# Patient Record
Sex: Female | Born: 1975 | Race: White | Hispanic: No | Marital: Married | Smoking: Former smoker
Health system: Southern US, Community
[De-identification: ages and names within clinical notes are randomized; demographics above are authoritative.]

---

## 2013-12-27 ENCOUNTER — Ambulatory Visit: Payer: Self-pay | Admitting: Physician Assistant

## 2014-11-23 ENCOUNTER — Ambulatory Visit: Payer: Self-pay | Admitting: Family Medicine

## 2017-01-29 ENCOUNTER — Encounter: Payer: Self-pay | Admitting: Emergency Medicine

## 2017-01-29 ENCOUNTER — Ambulatory Visit
Admission: EM | Admit: 2017-01-29 | Discharge: 2017-01-29 | Disposition: A | Discharge status: Home / Self Care | Payer: BC Managed Care – PPO | Attending: Family Medicine | Admitting: Family Medicine

## 2017-01-29 DIAGNOSIS — M5441 Lumbago with sciatica, right side: Secondary | ICD-10-CM

## 2017-01-29 MED ORDER — PREDNISONE 10 MG (21) PO TBPK: ORAL_TABLET | ORAL | 0 refills | Status: DC

## 2017-01-29 MED ORDER — CYCLOBENZAPRINE HCL 10 MG PO TABS: 10.0000 mg | ORAL_TABLET | Freq: Three times a day (TID) | ORAL | 0 refills | Status: DC | PRN

## 2017-01-29 NOTE — ED Provider Notes (Signed)
[facility]    CSN: [id number] Arrival date & time: 2/25/[id number]  0848  History   Chief Complaint Chief Complaint  Patient presents with  . Back Pain   HPI  41 year old female presents with low back pain.  Started on Tuesday. Located in the low back on the right side. Now radiating down her right leg. She does not recall an injury. She sits for any cement of time at work and chases around her young child at home. No recent fall. She has taken ibuprofen and used heat without improvement. Seems to improve with lumbar support. Exacerbated by certain movements. No incontinence or saddle anesthesia.  History reviewed. No pertinent past medical history.  There are no active problems to display for this patient.  History reviewed. No pertinent surgical history.  OB History    No data available       Home Medications    Prior to Admission medications   Medication Sig Start Date End Date Taking? Authorizing Provider  cyclobenzaprine (FLEXERIL) 10 MG tablet Take 1 tablet (10 mg total) by mouth 3 (three) times daily as needed for muscle spasms. 01/29/17   Tommie SamsJayce G Kamaya Keckler, DO  predniSONE (STERAPRED UNI-PAK 21 TAB) 10 MG (21) TBPK tablet Take 6 tabs by mouth daily  for 2 days, then 5 tabs for 2 days, then 4 tabs for 2 days, then 3 tabs for 2 days, 2 tabs for 2 days, then 1 tab by mouth daily for 2 days 01/29/17   Tommie SamsJayce G Braylynn Lewing, DO   Family History Family History  Problem Relation Age of Onset  . Hypertension Mother   . Hypertension Maternal Grandfather    Social History Social History  Substance Use Topics  . Smoking status: Former Games developermoker  . Smokeless tobacco: Never Used  . Alcohol use Yes   Allergies   Tamiflu [oseltamivir phosphate]  Review of Systems Review of Systems  Musculoskeletal: Positive for back pain.  All other systems reviewed and are negative.  Physical Exam Triage Vital Signs ED Triage Vitals  Enc Vitals Group     BP 01/29/17 0907 (!) 111/54   Pulse Rate 01/29/17 0907 70     Resp 01/29/17 0907 16     Temp 01/29/17 0907 98.7 F (37.1 C)     Temp Source 01/29/17 0907 Oral     SpO2 01/29/17 0907 99 %     Weight 01/29/17 0909 198 lb (89.8 kg)     Height 01/29/17 0909 5' 6.5" (1.689 m)     Head Circumference --      Peak Flow --      Pain Score 01/29/17 0912 4     Pain Loc --      Pain Edu? --      Excl. in GC? --    Updated Vital Signs BP (!) 111/54 (BP Location: Left Arm)   Pulse 70   Temp 98.7 F (37.1 C) (Oral)   Resp 16   Ht 5' 6.5" (1.689 m)   Wt 198 lb (89.8 kg)   LMP 01/19/2017   SpO2 99%   BMI 31.48 kg/m    Physical Exam  Constitutional: She is oriented to person, place, and time. She appears well-developed. No distress.  HENT:  Head: Normocephalic and atraumatic.  Eyes: Conjunctivae are normal.  Neck: Normal range of motion.  Cardiovascular: Normal rate and regular rhythm.   Pulmonary/Chest: Effort normal and breath sounds normal.  Abdominal: Soft. She exhibits no distension. There is no tenderness.  Musculoskeletal:  Low back - nontender to palpation. Negative straight leg raise.  Neurological: She is alert and oriented to person, place, and time.  Skin: No rash noted.  Psychiatric: She has a normal mood and affect.  Vitals reviewed.  UC Treatments / Results  Labs (all labs ordered are listed, but only abnormal results are displayed) Labs Reviewed - No data to display  EKG  EKG Interpretation None       Radiology No results found.  Procedures Procedures (including critical care time)  Medications Ordered in UC Medications - No data to display  Initial Impression / Assessment and Plan / UC Course  I have reviewed the triage vital signs and the nursing notes.  Pertinent labs & imaging results that were available during my care of the patient were reviewed by me and considered in my medical decision making (see chart for details).   41 year old female presents with musculoskeletal  low back pain. Treating with prednisone and Flexeril.  Final Clinical Impressions(s) / UC Diagnoses   Final diagnoses:  Acute right-sided low back pain with right-sided sciatica    New Prescriptions New Prescriptions   CYCLOBENZAPRINE (FLEXERIL) 10 MG TABLET    Take 1 tablet (10 mg total) by mouth 3 (three) times daily as needed for muscle spasms.   PREDNISONE (STERAPRED UNI-PAK 21 TAB) 10 MG (21) TBPK TABLET    Take 6 tabs by mouth daily  for 2 days, then 5 tabs for 2 days, then 4 tabs for 2 days, then 3 tabs for 2 days, 2 tabs for 2 days, then 1 tab by mouth daily for 2 days     Tommie Sams, DO 01/29/17 1610

## 2017-01-29 NOTE — Discharge Instructions (Signed)
Medications as prescribed.  If you worsen or fail to improve, please let us know.  Take care  Dr. Adriana Simasook

## 2017-01-29 NOTE — ED Triage Notes (Signed)
Low back pain after bending and lifting for 3 days. Patient noticed pain radiating down leg. More right then left

## 2017-05-01 ENCOUNTER — Ambulatory Visit
Admission: EM | Admit: 2017-05-01 | Discharge: 2017-05-01 | Disposition: A | Discharge status: Home / Self Care | Payer: BC Managed Care – PPO | Attending: Family Medicine | Admitting: Family Medicine

## 2017-05-01 ENCOUNTER — Ambulatory Visit (INDEPENDENT_AMBULATORY_CARE_PROVIDER_SITE_OTHER): Payer: BC Managed Care – PPO

## 2017-05-01 ENCOUNTER — Encounter: Payer: Self-pay | Admitting: *Deleted

## 2017-05-01 DIAGNOSIS — Z23 Encounter for immunization: Secondary | ICD-10-CM | POA: Diagnosis not present

## 2017-05-01 DIAGNOSIS — S99929A Unspecified injury of unspecified foot, initial encounter: Secondary | ICD-10-CM

## 2017-05-01 DIAGNOSIS — S91114A Laceration without foreign body of right lesser toe(s) without damage to nail, initial encounter: Secondary | ICD-10-CM | POA: Diagnosis not present

## 2017-05-01 DIAGNOSIS — S99921A Unspecified injury of right foot, initial encounter: Secondary | ICD-10-CM

## 2017-05-01 DIAGNOSIS — S92501B Displaced unspecified fracture of right lesser toe(s), initial encounter for open fracture: Secondary | ICD-10-CM

## 2017-05-01 DIAGNOSIS — S97121A Crushing injury of right lesser toe(s), initial encounter: Secondary | ICD-10-CM

## 2017-05-01 MED ORDER — TETANUS-DIPHTH-ACELL PERTUSSIS 5-2.5-18.5 LF-MCG/0.5 IM SUSP
0.5000 mL | Freq: Once | INTRAMUSCULAR | Status: AC
  Administered 2017-05-01: 0.5 mL via INTRAMUSCULAR

## 2017-05-01 MED ORDER — BUPIVACAINE HCL (PF) 0.5 % IJ SOLN
5.0000 mL | Freq: Once | INTRAMUSCULAR | Status: AC
  Administered 2017-05-01: 5 mL

## 2017-05-01 MED ORDER — CEPHALEXIN 500 MG PO CAPS: 500.0000 mg | ORAL_CAPSULE | Freq: Four times a day (QID) | ORAL | 0 refills | Status: AC

## 2017-05-01 NOTE — Discharge Instructions (Signed)
Take medication as prescribed. Rest. Elevate. Keep clean. Use postoperative shoe.   Follow up with podiatry this week as discussed. Close follow up is important.   Follow up with your primary care physician this week as needed. Return to Urgent care for redness, drainage, swelling, new or worsening concerns.

## 2017-05-01 NOTE — ED Triage Notes (Signed)
Patient dropped a heavy object on the middle toe of her right foot today. The toe appears to have a crush injury with bleeding.

## 2017-05-01 NOTE — ED Provider Notes (Signed)
[facility] ____________________________________________  Time seen: Approximately 12:58 PM  I have reviewed the triage vital signs and the nursing notes.   HISTORY  Chief Complaint Toe Injury  HPI Michelle Gibbs is a 41 y.o. female  presenting for evaluation of right third toe pain and bleeding after injury that occurred just prior to arrival. Patient states she was carrying a part of her microscope that fell directly down onto her right third toe. Denies any other pain or injuries. Denies any other pain to the foot. Denies fall to the ground, head injury or loss conscious. Reports this occurred at home. Reports last tetanus immunization approximately 6-7 years ago. States mild pain at this time, moderate pain to the toe with direct palpation or ambulation. Denies pain radiation, paresthesias or other complaints.  Denies chest pain, shortness of breath, abdominal pain,or rash. Denies recent sickness. Denies recent antibiotic use.   Patient's last menstrual period was 04/29/2017. Denies pregnancy  Joneen Caraway, MD: PCP   History reviewed. No pertinent past medical history.  There are no active problems to display for this patient.   History reviewed. No pertinent surgical history.   No current facility-administered medications for this encounter.   Current Outpatient Prescriptions:  .  cephALEXin (KEFLEX) 500 MG capsule, Take 1 capsule (500 mg total) by mouth 4 (four) times daily., Disp: 28 capsule, Rfl: 0 .  cyclobenzaprine (FLEXERIL) 10 MG tablet, Take 1 tablet (10 mg total) by mouth 3 (three) times daily as needed for muscle spasms., Disp: 30 tablet, Rfl: 0 .  predniSONE (STERAPRED UNI-PAK 21 TAB) 10 MG (21) TBPK tablet, Take 6 tabs by mouth daily  for 2 days, then 5 tabs for 2 days, then 4 tabs for 2 days, then 3 tabs for 2 days, 2 tabs for 2 days, then 1 tab by mouth daily for 2 days, Disp: 42 tablet, Rfl: 0  Allergies Tamiflu [oseltamivir  phosphate]  Family History  Problem Relation Age of Onset  . Hypertension Mother   . Hypertension Maternal Grandfather     Social History Social History  Substance Use Topics  . Smoking status: Former Games developer  . Smokeless tobacco: Never Used  . Alcohol use Yes    Review of Systems Constitutional: No fever/chills Eyes: No visual changes. ENT: No sore throat. Cardiovascular: Denies chest pain. Respiratory: Denies shortness of breath. Gastrointestinal: No abdominal pain.  No nausea, no vomiting.  No diarrhea.  No constipation. Genitourinary: Negative for dysuria. Musculoskeletal: Negative for back pain. Skin: As above.  ____________________________________________   PHYSICAL EXAM:  VITAL SIGNS: ED Triage Vitals  Enc Vitals Group     BP 05/01/17 1120 123/70     Pulse Rate 05/01/17 1120 (!) 59     Resp 05/01/17 1120 16     Temp 05/01/17 1120 98.5 F (36.9 C)     Temp Source 05/01/17 1120 Oral     SpO2 05/01/17 1120 100 %     Weight 05/01/17 1122 194 lb (88 kg)     Height 05/01/17 1122 5' 6.5" (1.689 m)     Head Circumference --      Peak Flow --      Pain Score 05/01/17 1122 4     Pain Loc --      Pain Edu? --      Excl. in GC? --     Constitutional: Alert and oriented. Well appearing and in no acute distress. Cardiovascular: Normal rate, regular rhythm. Grossly normal heart sounds.  Good  peripheral circulation. Respiratory: Normal respiratory effort without tachypnea nor retractions. Breath sounds are clear and equal bilaterally. No wheezes, rales, rhonchi. Musculoskeletal: No midline cervical, thoracic or lumbar tenderness to palpation. See skin below.  Neurologic:  Normal speech and language.Speech is normal. No gait instability.  Skin:  Skin is warm, dry. Except: Right third distal toe moderate tenderness to direct palpation with medial proximal nail edge pulled out of nail bed with small superficial 1.5cm laceration to lateral nail edge, mild active bleeding,  subungual hematoma present, normal distal sensation and capillary refill, right foot otherwise nontender.  Psychiatric: Mood and affect are normal. Speech and behavior are normal. Patient exhibits appropriate insight and judgment   ___________________________________________   LABS (all labs ordered are listed, but only abnormal results are displayed)  Labs Reviewed - No data to display  RADIOLOGY  Dg Toe 3rd Right  Result Date: 05/01/2017 CLINICAL DATA:  Battery fell on third toe with pain, initial encounter EXAM: RIGHT THIRD TOE COMPARISON:  None. FINDINGS: Comminuted fracture of the third distal phalanx is noted predominately involving the phalangeal tuft. No significant soft tissue abnormality is noted. IMPRESSION: There distal phalangeal fracture. Electronically Signed   By: Alcide Clever M.D.   On: 05/01/2017 12:16   ____________________________________________   PROCEDURES Procedures   Procedure(s) performed:  Procedure explained and verbal consent obtained. Consent: Verbal consent obtained. Written consent not obtained. Risks and benefits: risks, benefits and alternatives were discussed Patient identity confirmed: verbally with patient and hospital-assigned identification number  Consent given by: patient   Laceration Repair Location: right 3 toe Length: 1.5 cm  Foreign bodies: no foreign bodies Tendon involvement: none Nerve involvement: none Preparation: Patient was prepped and draped in the usual sterile fashion. Anesthesia with 0.5% bupivacaine digital block Irrigation solution: Sterile water and betadine Irrigation method: jet lavage Amount of cleaning: copious Medial nail reinserted into the nailbed Repaired with 4-0 nylon  Number of sutures: 3, one through lateral edge of nail Sterile cautery pen utilized and small bore made in proximal medial base of nail with mild bleeding Wound again irrigated with sterile water post Technique: simple interrupted    Approximation: loose Patient tolerate well. Wound well approximated post repair. Times one Steri-Strips also applied for support Antibiotic ointment and dressing applied. But he toes 3/4th taped Wound care instructions provided.  Observe for any signs of infection or other problems.   Postoperative shoe given.  INITIAL IMPRESSION / ASSESSMENT AND PLAN / ED COURSE  Pertinent labs & imaging results that were available during my care of the patient were reviewed by me and considered in my medical decision making (see chart for details).  Well-appearing patient. No acute distress. Right third toe per radiologist, distal phalangeal fracture that is comminuted predominantly involving the tuft. Patient with nail injury also to same toe. Denies other injuries. Patient's wound is an open fracture in which was copiously irrigated. The nail was reinserted and sewn into place. Buddy tape toes and postoperative shoe given. Discussed with patient need to follow-up closely with podiatry. Encourage podiatry appointment in 3-4 days. Will place patient on oral Keflex area patient states she'll take over-the-counter Tylenol or ibuprofen as needed for pain does not need a pre-and prescription. Encouraged rest, elevation and supportive care.Discussed indication, risks and benefits of medications with patient.  Discussed follow up with Primary care physician this week. Discussed follow up and return parameters including no resolution or any worsening concerns. Patient verbalized understanding and agreed to plan.   ____________________________________________  FINAL CLINICAL IMPRESSION(S) / ED DIAGNOSES  Final diagnoses:  Open fracture of phalanx of right third toe, initial encounter  Crushing injury of third toe of right foot, initial encounter  Injury of nail bed of toe  Laceration of third toe of right foot, initial encounter     Discharge Medication List as of 05/01/2017  2:01 PM    START taking these  medications   Details  cephALEXin (KEFLEX) 500 MG capsule Take 1 capsule (500 mg total) by mouth 4 (four) times daily., Starting Mon 05/01/2017, Until Mon 05/08/2017, Normal        Note: This dictation was prepared with Dragon dictation along with smaller phrase technology. Any transcriptional errors that result from this process are unintentional.         Renford DillsMiller, Brix Brearley, NP 05/01/17 1504

## 2019-08-20 ENCOUNTER — Other Ambulatory Visit: Payer: Self-pay | Admitting: Family Medicine

## 2019-08-20 ENCOUNTER — Other Ambulatory Visit: Payer: Self-pay | Admitting: Obstetrics and Gynecology

## 2019-08-20 DIAGNOSIS — Z1231 Encounter for screening mammogram for malignant neoplasm of breast: Secondary | ICD-10-CM

## 2019-09-04 ENCOUNTER — Ambulatory Visit
Admission: RE | Admit: 2019-09-04 | Discharge: 2019-09-04 | Disposition: A | Discharge status: Home / Self Care | Payer: Managed Care, Other (non HMO) | Source: Ambulatory Visit | Attending: Family Medicine | Admitting: Family Medicine

## 2019-09-04 ENCOUNTER — Other Ambulatory Visit: Payer: Self-pay

## 2019-09-04 ENCOUNTER — Encounter (INDEPENDENT_AMBULATORY_CARE_PROVIDER_SITE_OTHER): Payer: Self-pay

## 2019-09-04 DIAGNOSIS — Z1231 Encounter for screening mammogram for malignant neoplasm of breast: Secondary | ICD-10-CM | POA: Insufficient documentation

## 2019-09-05 ENCOUNTER — Other Ambulatory Visit: Payer: Self-pay | Admitting: Family Medicine

## 2019-09-05 ENCOUNTER — Inpatient Hospital Stay
Admission: RE | Admit: 2019-09-05 | Discharge: 2019-09-05 | Disposition: A | Discharge status: Home / Self Care | Payer: Self-pay | Source: Ambulatory Visit | Attending: Family Medicine | Admitting: Family Medicine

## 2019-09-05 DIAGNOSIS — Z1231 Encounter for screening mammogram for malignant neoplasm of breast: Secondary | ICD-10-CM

## 2019-09-10 ENCOUNTER — Other Ambulatory Visit: Payer: Self-pay | Admitting: Family Medicine

## 2019-09-10 DIAGNOSIS — N6310 Unspecified lump in the right breast, unspecified quadrant: Secondary | ICD-10-CM

## 2019-09-10 DIAGNOSIS — R928 Other abnormal and inconclusive findings on diagnostic imaging of breast: Secondary | ICD-10-CM

## 2019-09-12 ENCOUNTER — Ambulatory Visit
Admission: RE | Admit: 2019-09-12 | Discharge: 2019-09-12 | Disposition: A | Discharge status: Home / Self Care | Payer: Managed Care, Other (non HMO) | Source: Ambulatory Visit | Attending: Family Medicine | Admitting: Family Medicine

## 2019-09-12 DIAGNOSIS — N6310 Unspecified lump in the right breast, unspecified quadrant: Secondary | ICD-10-CM | POA: Diagnosis present

## 2019-09-12 DIAGNOSIS — R928 Other abnormal and inconclusive findings on diagnostic imaging of breast: Secondary | ICD-10-CM | POA: Diagnosis present

## 2019-09-13 ENCOUNTER — Other Ambulatory Visit: Payer: Self-pay | Admitting: Family Medicine

## 2019-09-13 DIAGNOSIS — N6310 Unspecified lump in the right breast, unspecified quadrant: Secondary | ICD-10-CM

## 2020-02-01 ENCOUNTER — Ambulatory Visit: Payer: Managed Care, Other (non HMO) | Attending: Internal Medicine

## 2020-02-01 DIAGNOSIS — Z23 Encounter for immunization: Secondary | ICD-10-CM | POA: Insufficient documentation

## 2020-02-01 NOTE — Progress Notes (Signed)
   Covid-19 Vaccination Clinic  Name:  [name]    MRN: [id number] DOB: [birth year]  02/01/2020  [name] was observed post Covid-19 immunization for 15 minutes without incidence. She was provided with Vaccine Information Sheet and instruction to access the V-Safe system.   [name] was instructed to call 911 with any severe reactions post vaccine: Marland Kitchen Difficulty breathing  . Swelling of your face and throat  . A fast heartbeat  . A bad rash all over your body  . Dizziness and weakness    Immunizations Administered    Name Date Dose VIS Date Route   Moderna COVID-19 Vaccine 02/01/2020 12:16 PM 0.5 mL 11/05/2019 Intramuscular   Manufacturer: Moderna   Lot: [id number]   NDC: 25894-834-75

## 2020-02-05 ENCOUNTER — Other Ambulatory Visit: Payer: Self-pay | Admitting: Pediatrics

## 2020-02-05 DIAGNOSIS — N6310 Unspecified lump in the right breast, unspecified quadrant: Secondary | ICD-10-CM

## 2020-02-29 ENCOUNTER — Ambulatory Visit: Payer: Managed Care, Other (non HMO) | Attending: Internal Medicine

## 2020-02-29 DIAGNOSIS — Z23 Encounter for immunization: Secondary | ICD-10-CM

## 2020-02-29 NOTE — Progress Notes (Signed)
   Covid-19 Vaccination Clinic  Name:  Michelle Gibbs    MRN: [id number] DOB: 1976-07-22  02/29/2020  [name] was observed post Covid-19 immunization for 15 minutes without incident. She was provided with Vaccine Information Sheet and instruction to access the V-Safe system.   [name] was instructed to call 911 with any severe reactions post vaccine: Marland Kitchen Difficulty breathing  . Swelling of face and throat  . A fast heartbeat  . A bad rash all over body  . Dizziness and weakness   Immunizations Administered    Name Date Dose VIS Date Route   Moderna COVID-19 Vaccine 02/29/2020  9:51 AM 0.5 mL 11/05/2019 Intramuscular   Manufacturer: Moderna   Lot: [id number]   NDC: 79432-761-47

## 2020-03-30 ENCOUNTER — Ambulatory Visit
Admission: RE | Admit: 2020-03-30 | Discharge: 2020-03-30 | Disposition: A | Discharge status: Home / Self Care | Payer: Managed Care, Other (non HMO) | Source: Ambulatory Visit | Attending: Pediatrics | Admitting: Pediatrics

## 2020-03-30 DIAGNOSIS — N6310 Unspecified lump in the right breast, unspecified quadrant: Secondary | ICD-10-CM | POA: Insufficient documentation

## 2020-03-31 ENCOUNTER — Other Ambulatory Visit: Payer: Self-pay | Admitting: Pediatrics

## 2020-03-31 DIAGNOSIS — N6310 Unspecified lump in the right breast, unspecified quadrant: Secondary | ICD-10-CM

## 2020-03-31 DIAGNOSIS — R928 Other abnormal and inconclusive findings on diagnostic imaging of breast: Secondary | ICD-10-CM

## 2020-04-10 ENCOUNTER — Ambulatory Visit
Admission: RE | Admit: 2020-04-10 | Discharge: 2020-04-10 | Disposition: A | Discharge status: Home / Self Care | Payer: BC Managed Care – PPO | Source: Ambulatory Visit | Attending: Pediatrics | Admitting: Pediatrics

## 2020-04-10 DIAGNOSIS — N6310 Unspecified lump in the right breast, unspecified quadrant: Secondary | ICD-10-CM

## 2020-04-10 DIAGNOSIS — R928 Other abnormal and inconclusive findings on diagnostic imaging of breast: Secondary | ICD-10-CM | POA: Insufficient documentation

## 2020-04-10 HISTORY — PX: BREAST BIOPSY: SHX20

## 2020-09-04 IMAGING — MG MM DIGITAL SCREENING BILAT W/ TOMO W/ CAD
8 series · 8 of 24 positions shown · non-contrast
Comparison: Mammograms of the left breast 05/09/2011.

CLINICAL DATA: Screening.

EXAM:
DIGITAL SCREENING BILATERAL MAMMOGRAM WITH TOMO AND CAD

[L MLO synth-2D]
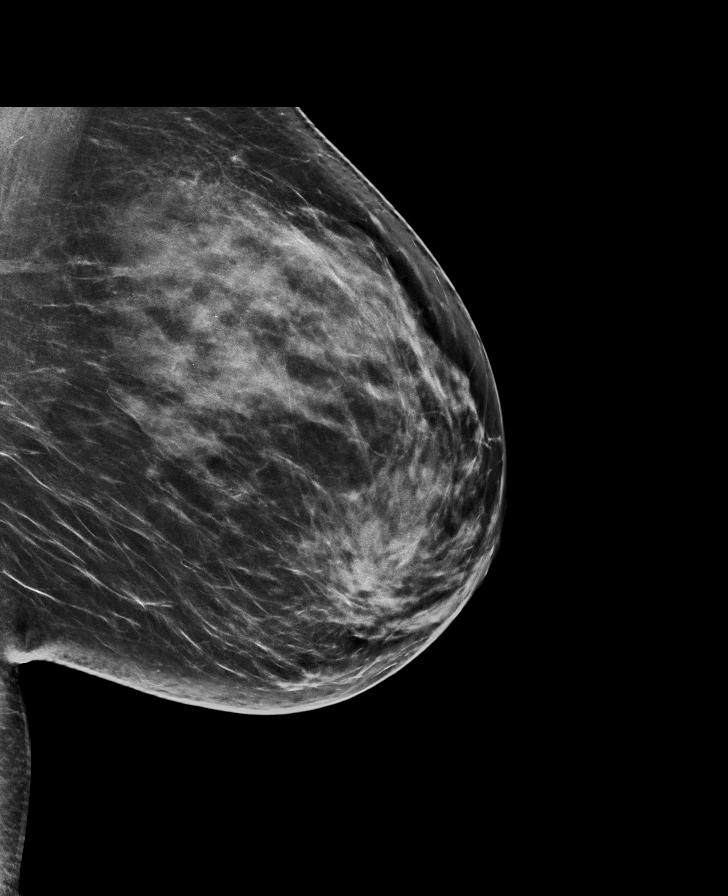

[L CC synth-2D]
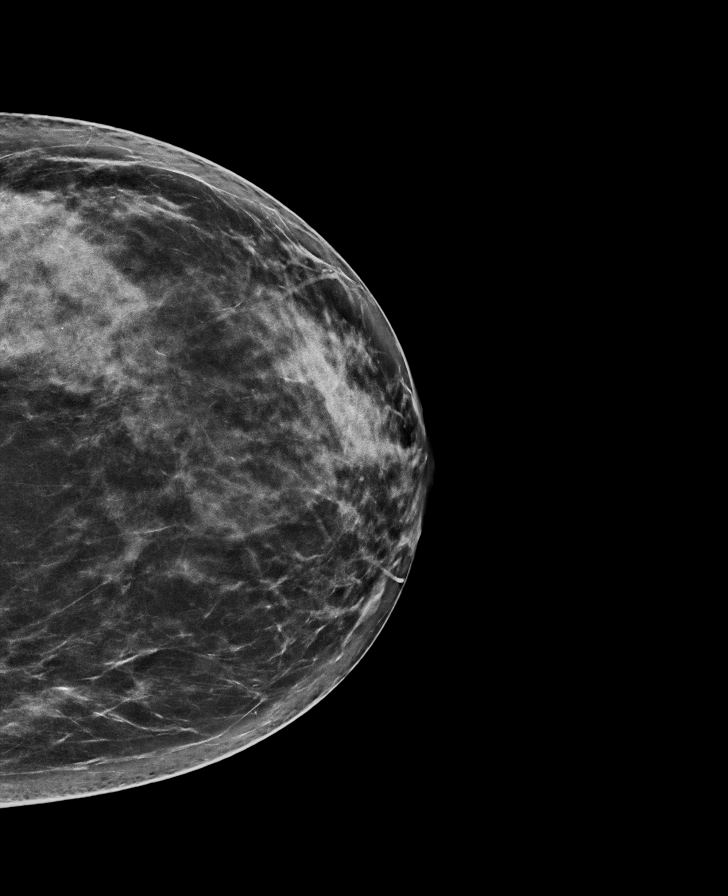

[R MLO synth-2D]
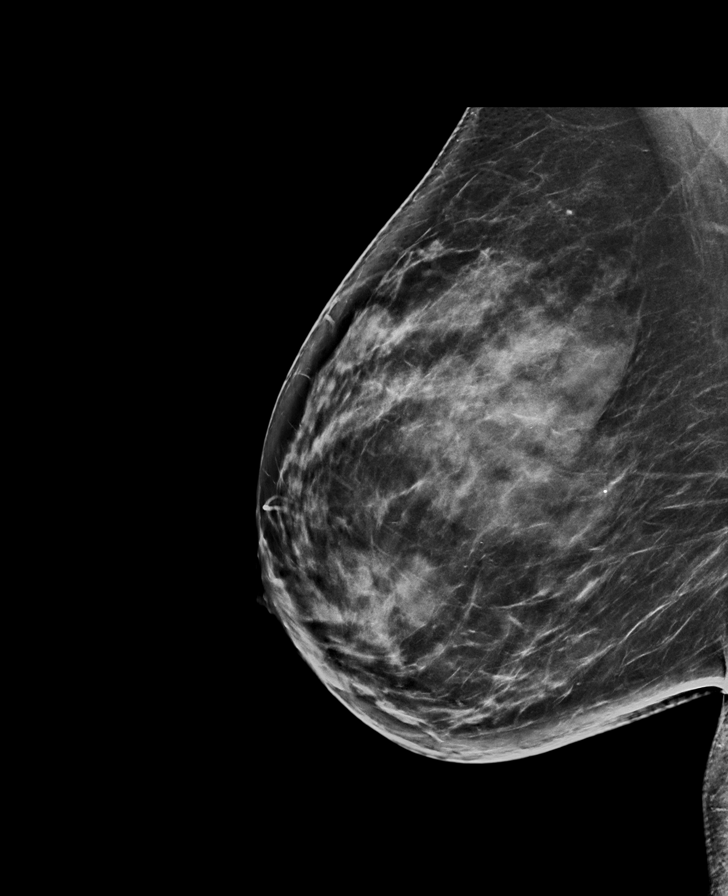

[R CC synth-2D]
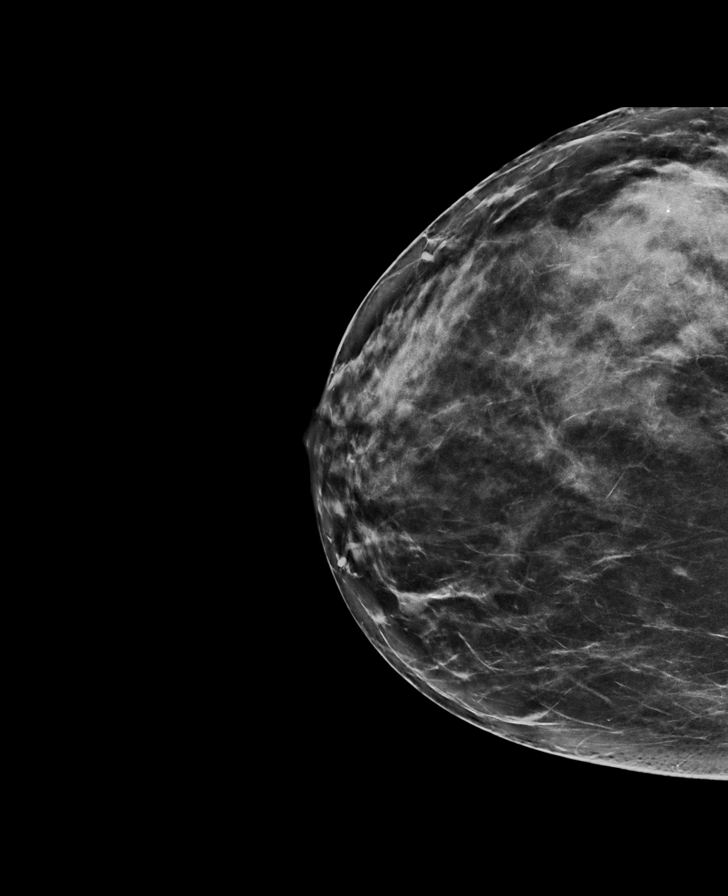

[L MLO tomo · tomo slice 45/89.0]
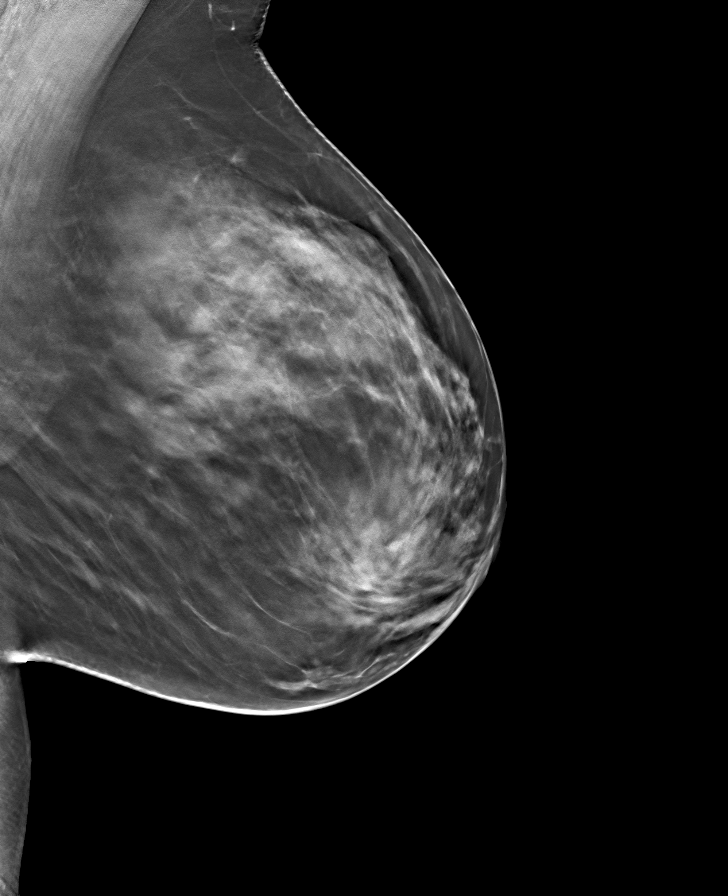

[R CC tomo · tomo slice 41/81.0]
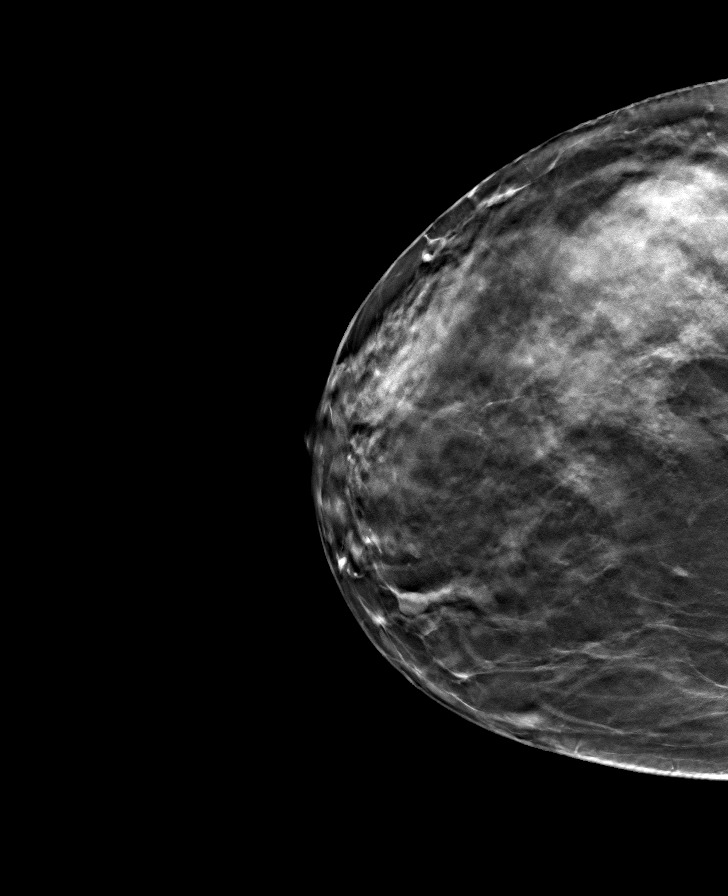

[L CC tomo · tomo slice 40/79.0]
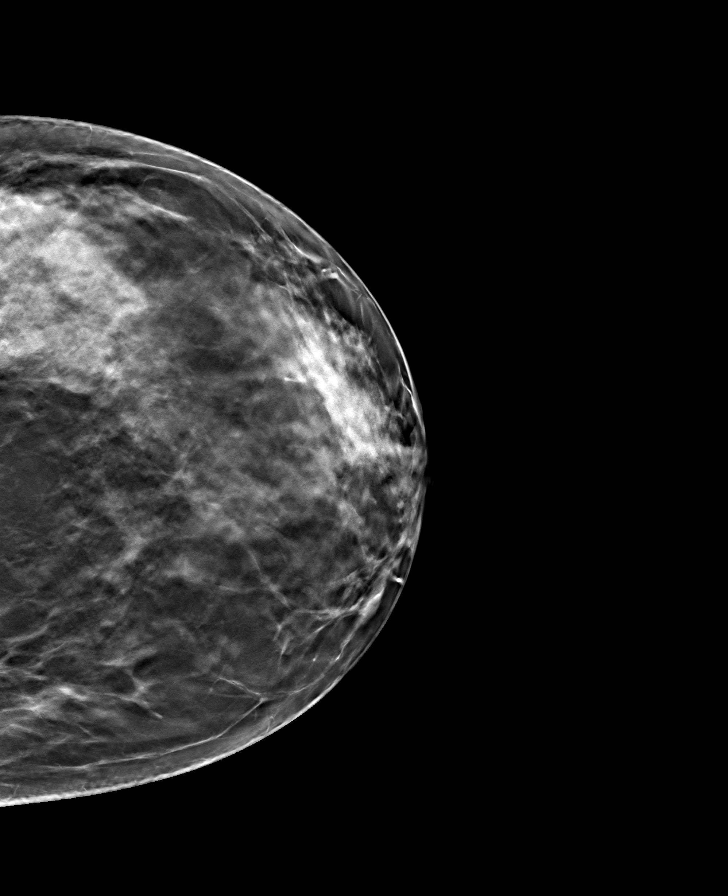

[R MLO tomo · tomo slice 45/90.0]
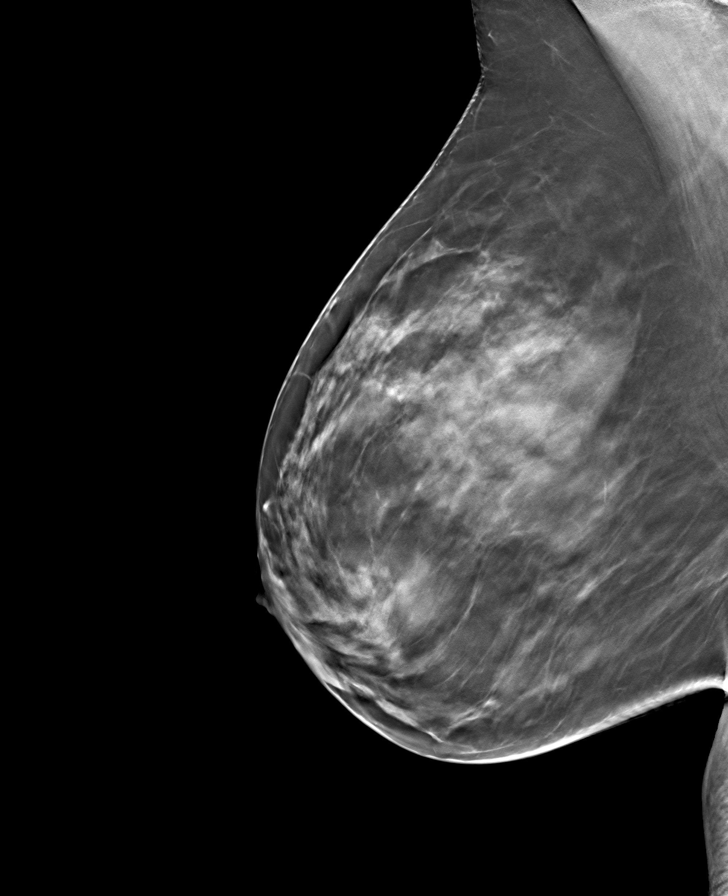

[8 of 24 positions shown; findings below may reference images not displayed]

ACR Breast Density Category c: The breast tissue is heterogeneously
dense, which may obscure small masses.
FINDINGS: In the right breast, a possible mass warrants further evaluation. In
the left breast, no findings suspicious for malignancy. Images were
processed with CAD.
IMPRESSION: Further evaluation is suggested for possible mass in the right
breast.

RECOMMENDATION:
Ultrasound of the right breast. (Code:H7-2-MMF)

The patient will be contacted regarding the findings, and additional
imaging will be scheduled.

BI-RADS CATEGORY  0: Incomplete. Need additional imaging evaluation
and/or prior mammograms for comparison.

## 2021-04-11 IMAGING — MG MM BREAST LOCALIZATION CLIP
4 series · 4 of 12 positions shown · non-contrast
Comparison: With priors.

CLINICAL DATA: Status post ultrasound-guided core biopsy of a right
breast mass.

EXAM:
3D DIAGNOSTIC RIGHT MAMMOGRAM POST ULTRASOUND BIOPSY

[R ML synth-2D]
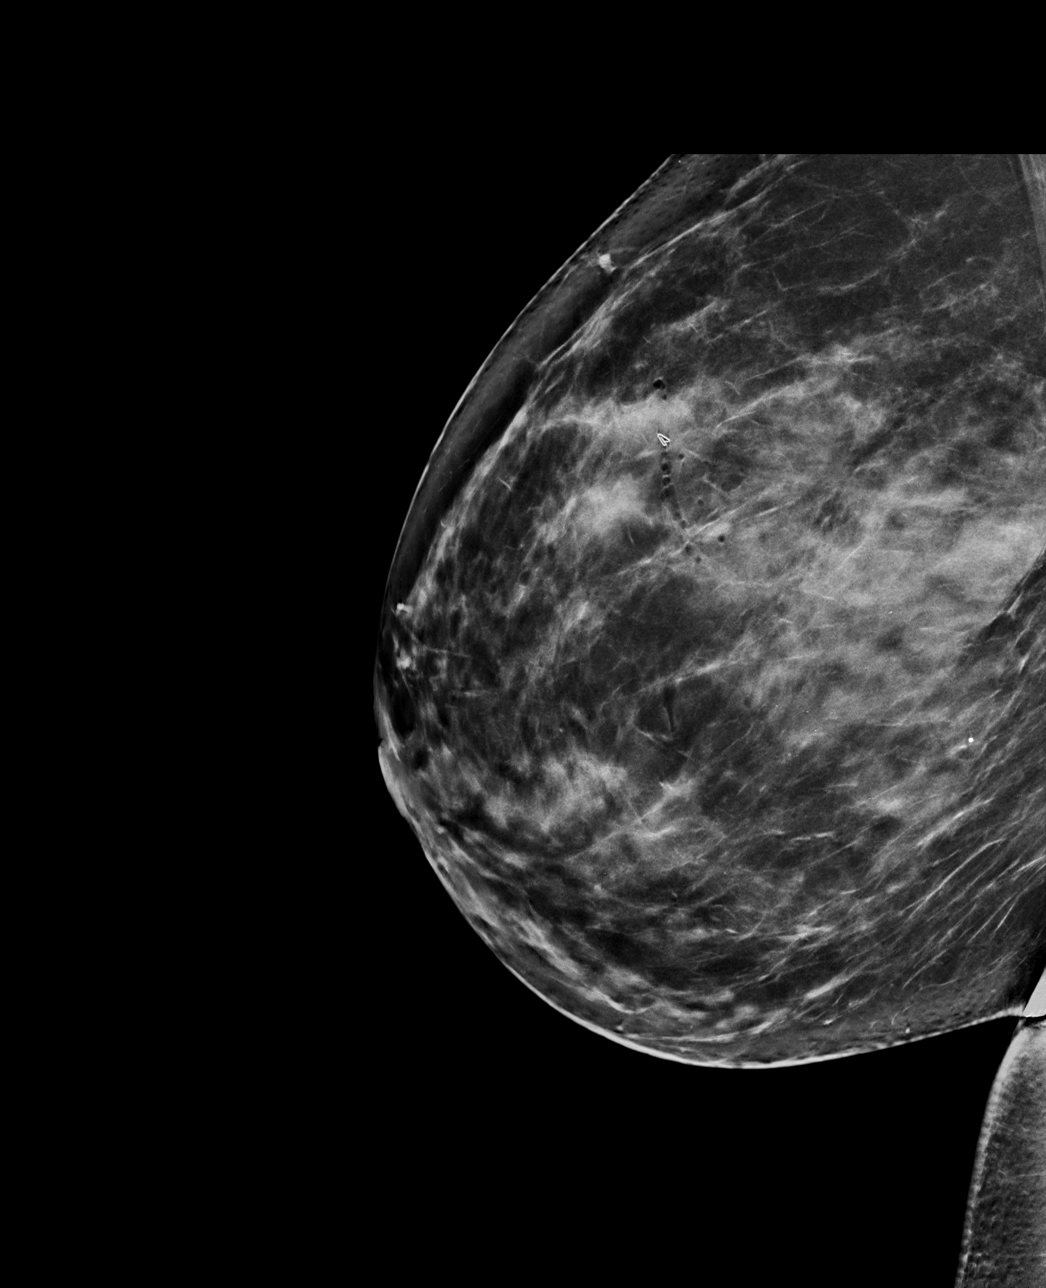

[R CC synth-2D]
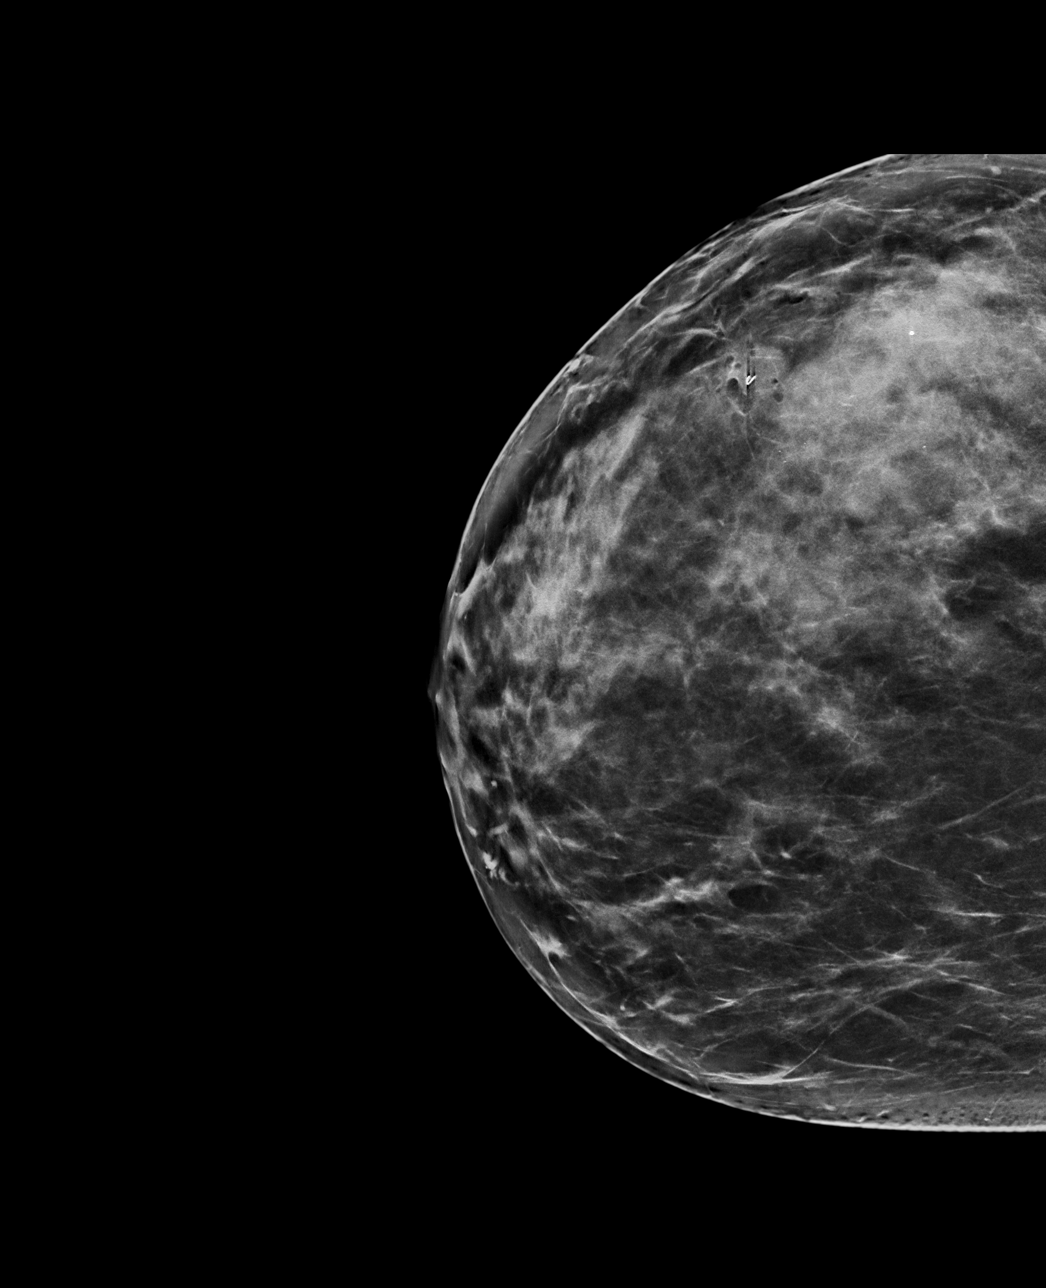

[R CC tomo · tomo slice 44/87.0]
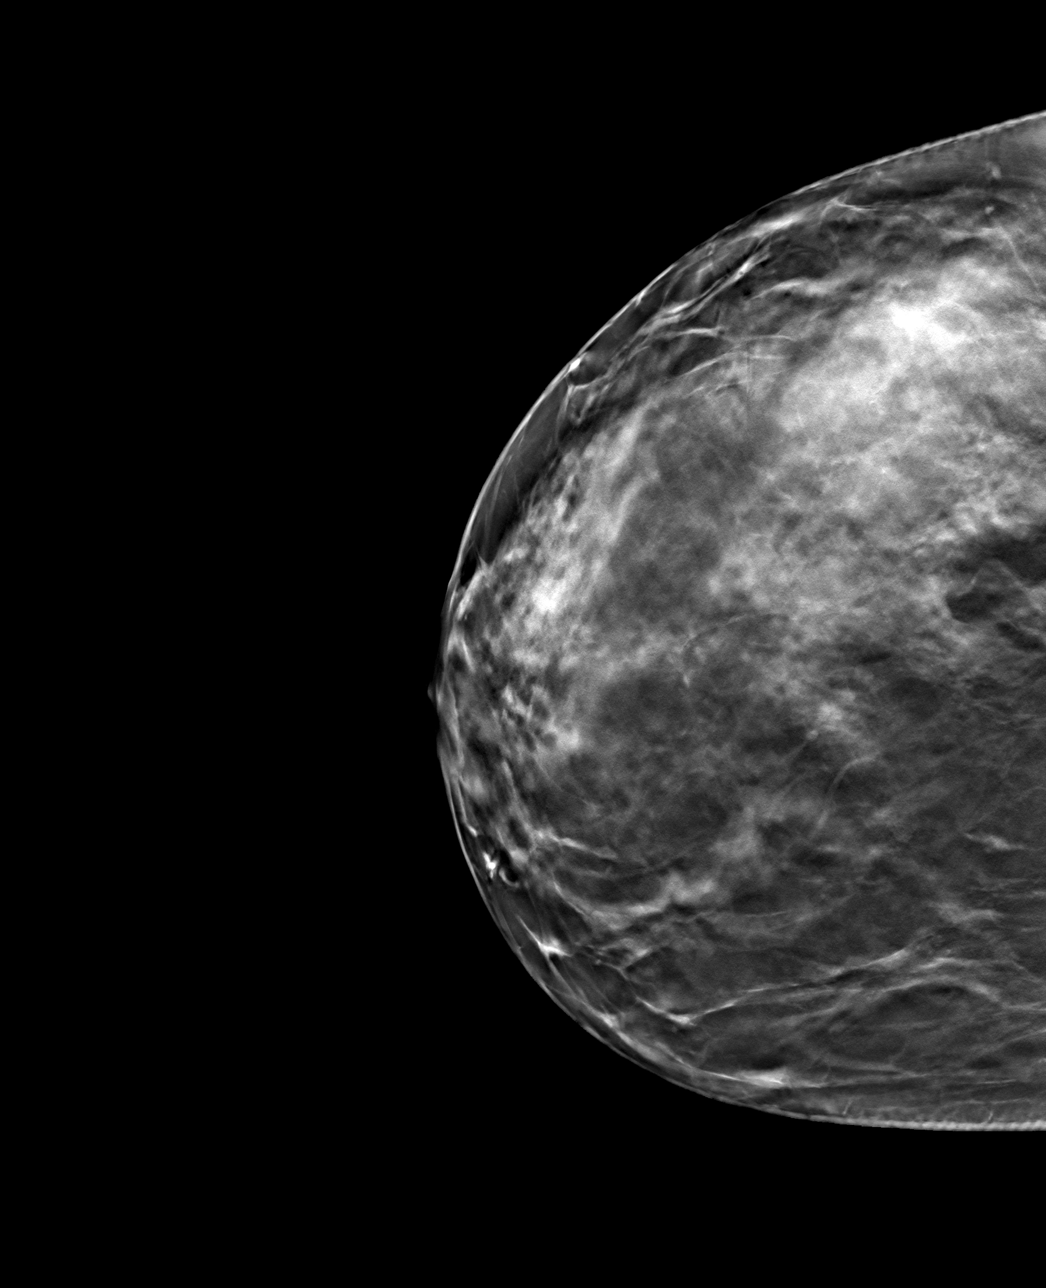

[R ML tomo · tomo slice 47/93.0]
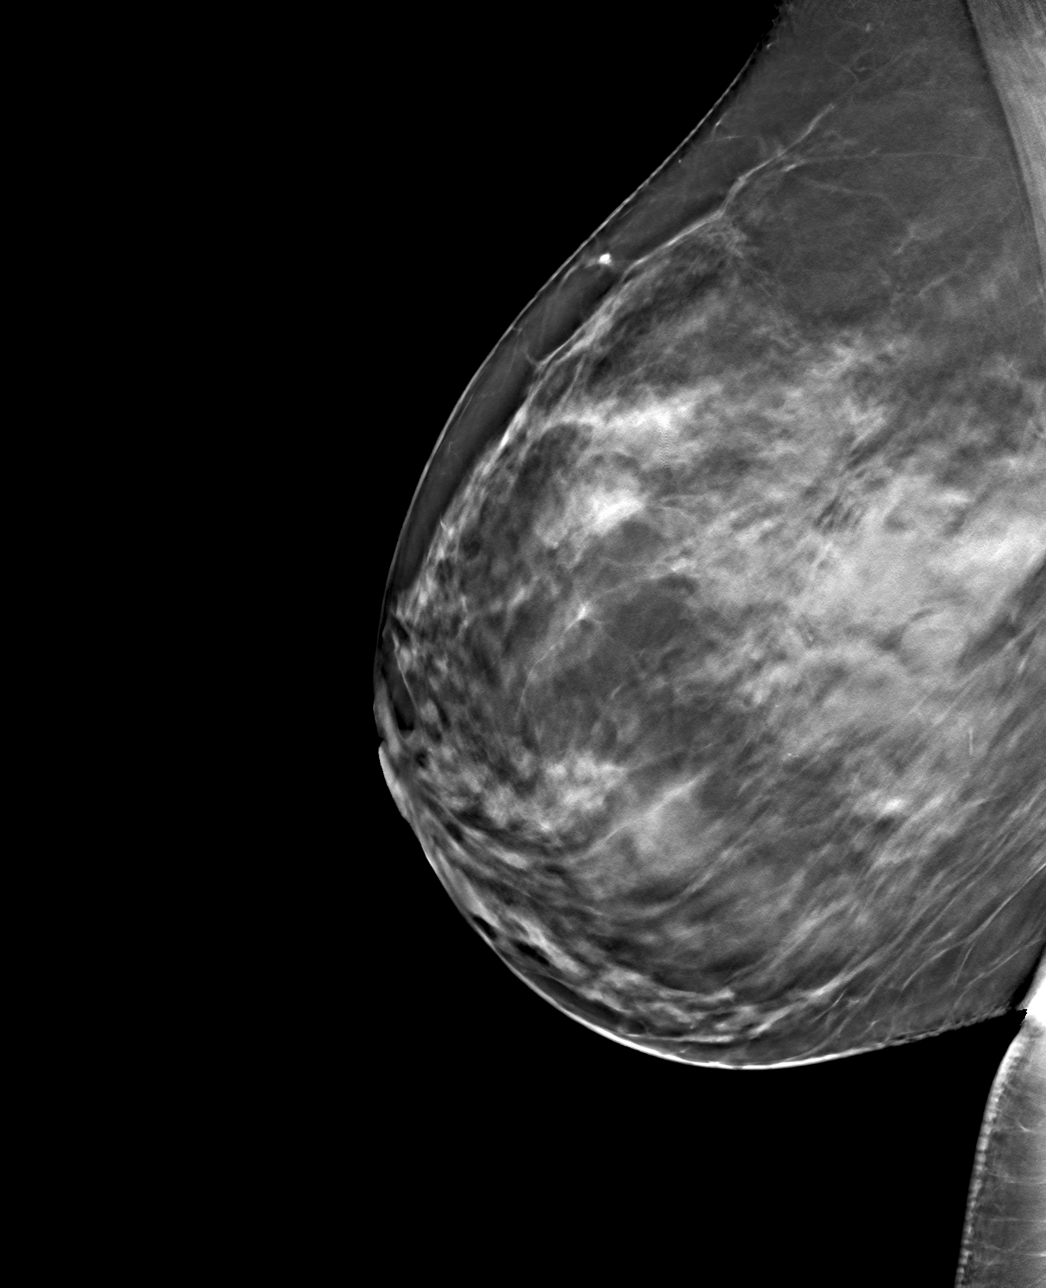

[4 of 12 positions shown; findings below may reference images not displayed]

FINDINGS: 3D Mammographic images were obtained following ultrasound guided
biopsy of a mass in the 10 o'clock region of the right breast. The
biopsy marking clip is in the upper-outer quadrant of the right
breast in appropriate position. However the biopsied mass did not
correlate though with the mass seen on screening mammogram dated
09/04/2019.

Sonographic evaluation of the breast was performed showing a benign
anechoic cyst in the right breast at 9 o'clock 9 cm from the nipple
measuring 1.1 x 0.8 x 1.1 cm. This corresponds with the mass seen on
the patient's screening mammogram.
IMPRESSION: Appropriate positioning of the heart shaped biopsy marking clip at
the site of biopsy in the upper-outer quadrant of the right breast.
A benign appearing cyst is seen in the right breast at 9 o'clock 9
cm from the nipple.

Final Assessment: Post Procedure Mammograms for Marker Placement

## 2021-05-07 ENCOUNTER — Other Ambulatory Visit: Payer: Self-pay

## 2021-05-07 ENCOUNTER — Other Ambulatory Visit: Payer: Self-pay | Admitting: Pediatrics

## 2021-05-07 DIAGNOSIS — Z1231 Encounter for screening mammogram for malignant neoplasm of breast: Secondary | ICD-10-CM

## 2021-05-12 ENCOUNTER — Ambulatory Visit
Admission: RE | Admit: 2021-05-12 | Discharge: 2021-05-12 | Disposition: A | Discharge status: Home / Self Care | Payer: BC Managed Care – PPO | Source: Ambulatory Visit

## 2021-05-12 ENCOUNTER — Other Ambulatory Visit: Payer: Self-pay

## 2021-05-12 DIAGNOSIS — Z1231 Encounter for screening mammogram for malignant neoplasm of breast: Secondary | ICD-10-CM | POA: Diagnosis present

## 2021-11-30 ENCOUNTER — Other Ambulatory Visit: Payer: Self-pay

## 2021-11-30 ENCOUNTER — Ambulatory Visit
Admission: EM | Admit: 2021-11-30 | Discharge: 2021-11-30 | Disposition: A | Discharge status: Home / Self Care | Payer: BC Managed Care – PPO | Attending: Internal Medicine | Admitting: Internal Medicine

## 2021-11-30 ENCOUNTER — Ambulatory Visit (INDEPENDENT_AMBULATORY_CARE_PROVIDER_SITE_OTHER): Payer: BC Managed Care – PPO

## 2021-11-30 DIAGNOSIS — S92314B Nondisplaced fracture of first metatarsal bone, right foot, initial encounter for open fracture: Secondary | ICD-10-CM | POA: Diagnosis not present

## 2021-11-30 DIAGNOSIS — S92354A Nondisplaced fracture of fifth metatarsal bone, right foot, initial encounter for closed fracture: Secondary | ICD-10-CM | POA: Diagnosis not present

## 2021-11-30 MED ORDER — HYDROCODONE-ACETAMINOPHEN 5-325 MG PO TABS: 2.0000 | ORAL_TABLET | ORAL | 0 refills | Status: AC | PRN

## 2021-11-30 NOTE — ED Triage Notes (Signed)
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 Pt states that it is bruised and hurts more today.

## 2021-11-30 NOTE — Discharge Instructions (Addendum)
Follow up with orthopedics this week

## 2021-11-30 NOTE — ED Provider Notes (Signed)
[facility]    CSN: [id number] Arrival date & time: 11/30/21  0910      History   Chief Complaint Chief Complaint  Patient presents with   Foot Pain    HPI Michelle Gibbs is a 45 y.o. 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 She thought she was doing fine, but pain was worse yesterday. Denies numbness. She drove herself here today.     History reviewed. No pertinent past medical history.  There are no problems to display for this patient.   Past Surgical History:  Procedure Laterality Date   BREAST BIOPSY Right 04/10/2020   [place] Bx, heart clip, pending path     OB History   No obstetric history on file.      Home Medications    Prior to Admission medications   Medication Sig Start Date End Date Taking? Authorizing Provider  HYDROcodone-acetaminophen (NORCO/VICODIN) 5-325 MG tablet Take 2 tablets by mouth every 4 (four) hours as needed. 11/30/21  Yes Rodriguez-Southworth, Nettie Elm, PA-C    Family History Family History  Problem Relation Age of Onset   Hypertension Mother    Hypertension Maternal Grandfather    Breast cancer Neg Hx     Social History Social History   Tobacco Use   Smoking status: Former   Smokeless tobacco: Never  Substance Use Topics   Alcohol use: Yes   Drug use: No     Allergies   Tamiflu [oseltamivir phosphate]   Review of Systems Review of Systems  Musculoskeletal:  Positive for arthralgias and gait problem.  Skin:  Positive for color change. Negative for rash and wound.  Neurological:  Negative for numbness.    Physical Exam Triage Vital Signs ED Triage Vitals  Enc Vitals Group     BP 11/30/21 0926 106/70     Pulse Rate 11/30/21 0926 65     Resp 11/30/21 0926 18     Temp 11/30/21 0926 98.7 F (37.1 C)     Temp Source 11/30/21 0926 Oral     SpO2 11/30/21 0926 99 %     Weight 11/30/21 0924 187 lb (84.8 kg)     Height  11/30/21 0924 5\' 6"  (1.676 m)     Head Circumference --      Peak Flow --      Pain Score 11/30/21 0924 8     Pain Loc --      Pain Edu? --      Excl. in GC? --    No data found.  Updated Vital Signs BP 106/70 (BP Location: Left Arm)    Pulse 65    Temp 98.7 F (37.1 C) (Oral)    Resp 18    Ht 5\' 6"  (1.676 m)    Wt 187 lb (84.8 kg)    LMP 11/08/2021    SpO2 99%    BMI 30.18 kg/m   Visual Acuity Right Eye Distance:   Left Eye Distance:   Bilateral Distance:    Right Eye Near:   Left Eye Near:    Bilateral Near:     Physical Exam Vitals and nursing note reviewed.  Constitutional:      General: She is not in acute distress.    Appearance: She is not toxic-appearing.  Eyes:     General: No scleral icterus.    Conjunctiva/sclera: Conjunctivae normal.  Cardiovascular:     Pulses: Normal pulses.  Pulmonary:     Effort: Pulmonary effort is normal.  Musculoskeletal:     Cervical back: Neck supple.     Comments: R FOOT - with faint ecchymosis on lateral foot with point tenderness of 5th distal metatarsal. The rest of the metatarsals and ankle are not tender.   Skin:    Findings: Bruising present. No erythema or rash.  Neurological:     Mental Status: She is alert and oriented to person, place, and time.     Gait: Gait abnormal.  Psychiatric:        Mood and Affect: Mood normal.        Behavior: Behavior normal.        Thought Content: Thought content normal.        Judgment: Judgment normal.     UC Treatments / Results  Labs (all labs ordered are listed, but only abnormal results are displayed) Labs Reviewed - No data to display  EKG   Radiology DG Foot Complete Right  Result Date: 11/30/2021 CLINICAL DATA:  Sport Injury, jujitsu practice with injury to right foot with pain and bruising EXAM: RIGHT FOOT COMPLETE - 3+ VIEW COMPARISON:  None. FINDINGS: Nondisplaced non articular oblique distal shaft fracture in the right fifth metatarsal with surrounding mild soft  tissue swelling. No additional fracture. No dislocation. No focal osseous lesions. No significant arthropathy. No radiopaque foreign bodies. Lisfranc joint appears intact. IMPRESSION: Nondisplaced nonarticular oblique distal shaft fracture in the right fifth metatarsal. Electronically Signed   By: Delbert Phenix M.D.   On: 11/30/2021 09:50    Procedures Procedures (including critical care time)  Medications Ordered in UC Medications - No data to display  Initial Impression / Assessment and Plan / UC Course  I have reviewed the triage vital signs and the nursing notes. Pertinent  imaging results that were available during my care of the patient were reviewed by me and considered in my medical decision making (see chart for details). Non displaced 5th metatarsal fracture Since she is driving she could not be placed on a Boot and she would like to see ortho today, so will go home and have her husband take her to [employer].  I placed her on Norco prn pain.   Final Clinical Impressions(s) / UC Diagnoses   Final diagnoses:  Closed nondisplaced fracture of fifth metatarsal bone of right foot, initial encounter     Discharge Instructions      Follow up with orthopedics this week.     ED Prescriptions     Medication Sig Dispense Auth. Provider   HYDROcodone-acetaminophen (NORCO/VICODIN) 5-325 MG tablet Take 2 tablets by mouth every 4 (four) hours as needed. 15 tablet Rodriguez-Southworth, Nettie Elm, PA-C      I have reviewed the PDMP during this encounter.   Garey Ham, PA-C 11/30/21 1011

## 2021-11-30 NOTE — ED Triage Notes (Signed)
Per S Southworth, pt does not need boot - will go straight to Ortho.

## 2022-05-04 ENCOUNTER — Other Ambulatory Visit: Payer: Self-pay | Admitting: Pediatrics

## 2022-05-04 DIAGNOSIS — Z1231 Encounter for screening mammogram for malignant neoplasm of breast: Secondary | ICD-10-CM

## 2022-06-01 ENCOUNTER — Ambulatory Visit
Admission: RE | Admit: 2022-06-01 | Discharge: 2022-06-01 | Disposition: A | Discharge status: Home / Self Care | Payer: BC Managed Care – PPO | Source: Ambulatory Visit | Attending: Pediatrics | Admitting: Pediatrics

## 2022-06-01 DIAGNOSIS — Z1231 Encounter for screening mammogram for malignant neoplasm of breast: Secondary | ICD-10-CM | POA: Diagnosis present

## 2023-05-05 ENCOUNTER — Other Ambulatory Visit: Payer: Self-pay | Admitting: Pediatrics

## 2023-05-05 DIAGNOSIS — Z1231 Encounter for screening mammogram for malignant neoplasm of breast: Secondary | ICD-10-CM

## 2023-05-15 ENCOUNTER — Ambulatory Visit: Payer: BC Managed Care – PPO

## 2023-06-20 ENCOUNTER — Ambulatory Visit
Admission: RE | Admit: 2023-06-20 | Discharge: 2023-06-20 | Disposition: A | Discharge status: Home / Self Care | Payer: Commercial Managed Care - PPO | Source: Ambulatory Visit | Attending: Pediatrics | Admitting: Pediatrics

## 2023-06-20 DIAGNOSIS — Z1231 Encounter for screening mammogram for malignant neoplasm of breast: Secondary | ICD-10-CM | POA: Insufficient documentation

## 2023-10-01 ENCOUNTER — Ambulatory Visit
Admission: EM | Admit: 2023-10-01 | Discharge: 2023-10-01 | Disposition: A | Discharge status: Home / Self Care | Payer: Commercial Managed Care - PPO | Attending: Physician Assistant | Admitting: Physician Assistant

## 2023-10-01 ENCOUNTER — Ambulatory Visit: Payer: Commercial Managed Care - PPO

## 2023-10-01 ENCOUNTER — Encounter: Payer: Self-pay | Admitting: Emergency Medicine

## 2023-10-01 DIAGNOSIS — R509 Fever, unspecified: Secondary | ICD-10-CM | POA: Diagnosis not present

## 2023-10-01 DIAGNOSIS — J189 Pneumonia, unspecified organism: Secondary | ICD-10-CM | POA: Diagnosis not present

## 2023-10-01 DIAGNOSIS — Z1152 Encounter for screening for COVID-19: Secondary | ICD-10-CM | POA: Insufficient documentation

## 2023-10-01 DIAGNOSIS — R051 Acute cough: Secondary | ICD-10-CM | POA: Diagnosis present

## 2023-10-01 LAB — SARS CORONAVIRUS 2 BY RT PCR: SARS Coronavirus 2 by RT PCR: NEGATIVE

## 2023-10-01 MED ORDER — AZITHROMYCIN 250 MG PO TABS: 250.0000 mg | ORAL_TABLET | Freq: Every day | ORAL | 0 refills | Status: AC

## 2023-10-01 MED ORDER — PROMETHAZINE-DM 6.25-15 MG/5ML PO SYRP: 5.0000 mL | ORAL_SOLUTION | Freq: Four times a day (QID) | ORAL | 0 refills | Status: AC | PRN

## 2023-10-01 MED ORDER — AMOXICILLIN-POT CLAVULANATE 875-125 MG PO TABS: 1.0000 | ORAL_TABLET | Freq: Two times a day (BID) | ORAL | 0 refills | Status: AC

## 2023-10-01 NOTE — Discharge Instructions (Signed)
-  You have pneumonia.  I sent 2 different antibiotics to the pharmacy to cover most likely bacteria.  Take full course.  You should be breaking the fever and starting to feel better in a couple days with a cough can be persistent for a couple of weeks in some cases. - Increase rest and fluids.  Continue Tylenol and Motrin as needed for fever, body aches and headaches. - If uncontrolled fever, weakness or increased breathing difficulty please go to the ER. - Follow-up with your primary doctor in 3 to 4 weeks to repeat the x-ray make sure the pneumonia has cleared.

## 2023-10-01 NOTE — ED Triage Notes (Signed)
Patient reports bodyaches that started on Tuesday.  Patient reports fever, cough, runny nose and headache that started late Wed.

## 2023-10-01 NOTE — ED Provider Notes (Signed)
[facility]    CSN: [id number] Arrival date & time: 10/01/23  0807      History   Chief Complaint Chief Complaint  Patient presents with   Cough   Fever   Generalized Body Aches    HPI Michelle Gibbs is a 47 y.o. female presenting for 4-day history of bodyaches, fatigue, headaches, cough, congestion and fever up to 100.3 degrees.  Also reports a little chest pain when coughing and occasional mild shortness of breath.  Symptoms have not gotten any better or worse from onset.  Patient works at a school as a Runner, broadcasting/film/video.  Denies sore throat, nasal congestion, abdominal pain, vomiting or diarrhea. Has taken OTC meds, but no meds today and current temp 98.5 degrees.  HPI  History reviewed. No pertinent past medical history.  There are no problems to display for this patient.   Past Surgical History:  Procedure Laterality Date   BREAST BIOPSY Right 04/10/2020   [place] Bx, heart clip, pending path     OB History   No obstetric history on file.      Home Medications    Prior to Admission medications   Medication Sig Start Date End Date Taking? Authorizing Provider  amoxicillin-clavulanate (AUGMENTIN) 875-125 MG tablet Take 1 tablet by mouth every 12 (twelve) hours for 7 days. 10/01/23 10/08/23 Yes Shirlee Latch, PA-C  azithromycin (ZITHROMAX) 250 MG tablet Take 1 tablet (250 mg total) by mouth daily. Take first 2 tablets together, then 1 every day until finished. 10/01/23  Yes Shirlee Latch, PA-C  Multiple Vitamin (MULTIVITAMIN) tablet Take 1 tablet by mouth daily.   Yes [provider]  promethazine-dextromethorphan (PROMETHAZINE-DM) 6.25-15 MG/5ML syrup Take 5 mLs by mouth 4 (four) times daily as needed. 10/01/23  Yes Shirlee Latch, PA-C  HYDROcodone-acetaminophen (NORCO/VICODIN) 5-325 MG tablet Take 2 tablets by mouth every 4 (four) hours as needed. 11/30/21   Rodriguez-Southworth, Nettie Elm, PA-C    Family History Family History  Problem  Relation Age of Onset   Hypertension Mother    Hypertension Maternal Grandfather    Breast cancer Neg Hx     Social History Social History   Tobacco Use   Smoking status: Former   Smokeless tobacco: Never  Advertising account planner   Vaping status: Never Used  Substance Use Topics   Alcohol use: Yes   Drug use: No     Allergies   Tamiflu [oseltamivir phosphate]   Review of Systems Review of Systems  Constitutional:  Positive for fatigue and fever. Negative for chills and diaphoresis.  HENT:  Positive for congestion and rhinorrhea. Negative for ear pain, sinus pressure, sinus pain and sore throat.   Respiratory:  Positive for cough and shortness of breath. Negative for wheezing.   Cardiovascular:  Positive for chest pain.  Gastrointestinal:  Negative for abdominal pain, nausea and vomiting.  Musculoskeletal:  Positive for myalgias.  Skin:  Negative for rash.  Neurological:  Positive for headaches. Negative for weakness.  Hematological:  Negative for adenopathy.     Physical Exam Triage Vital Signs ED Triage Vitals  Encounter Vitals Group     BP      Systolic BP Percentile      Diastolic BP Percentile      Pulse      Resp      Temp      Temp src      SpO2      Weight      Height  Head Circumference      Peak Flow      Pain Score      Pain Loc      Pain Education      Exclude from Growth Chart    No data found.  Updated Vital Signs BP 130/83 (BP Location: Right Arm)   Pulse 71   Temp 98.5 F (36.9 C) (Oral)   Resp 14   Ht 5\' 6"  (1.676 m)   Wt 175 lb (79.4 kg)   LMP 09/17/2023 (Approximate)   SpO2 97%   BMI 28.25 kg/m   Physical Exam Vitals and nursing note reviewed.  Constitutional:      General: She is not in acute distress.    Appearance: Normal appearance. She is ill-appearing. She is not toxic-appearing.  HENT:     Head: Normocephalic and atraumatic.     Nose: Congestion present.     Mouth/Throat:     Mouth: Mucous membranes are moist.      Pharynx: Oropharynx is clear.  Eyes:     General: No scleral icterus.       Right eye: No discharge.        Left eye: No discharge.     Conjunctiva/sclera: Conjunctivae normal.  Cardiovascular:     Rate and Rhythm: Normal rate and regular rhythm.     Heart sounds: Normal heart sounds.  Pulmonary:     Effort: Pulmonary effort is normal. No respiratory distress.     Breath sounds: Normal breath sounds. No wheezing, rhonchi or rales.  Musculoskeletal:     Cervical back: Neck supple.  Skin:    General: Skin is dry.  Neurological:     General: No focal deficit present.     Mental Status: She is alert. Mental status is at baseline.     Motor: No weakness.     Gait: Gait normal.  Psychiatric:        Mood and Affect: Mood normal.        Behavior: Behavior normal.        Thought Content: Thought content normal.      UC Treatments / Results  Labs (all labs ordered are listed, but only abnormal results are displayed) Labs Reviewed  SARS CORONAVIRUS 2 BY RT PCR    EKG   Radiology DG Chest 2 View  Result Date: 10/01/2023 CLINICAL DATA:  [facility]-DGfever, cough, congestion, sob x 5 days. neg covid EXAM: CHEST - 2 VIEW COMPARISON:  None Available. FINDINGS: Normal cardiac silhouette. Subtle airspace disease in the RIGHT middle lobe. Subtle airspace disease in the RIGHT upper lobe. LEFT lung clear. No acute osseous abnormality. IMPRESSION: RIGHT upper lobe and RIGHT middle lobe pneumonia. Followup PA and lateral chest X-ray is recommended in 3-4 weeks following trial of antibiotic therapy to ensure resolution and exclude underlying malignancy. Electronically Signed   By: Genevive Bi M.D.   On: 10/01/2023 09:23    Procedures Procedures (including critical care time)  Medications Ordered in UC Medications - No data to display  Initial Impression / Assessment and Plan / UC Course  I have reviewed the triage vital signs and the nursing notes.  Pertinent labs & imaging results that  were available during my care of the patient were reviewed by me and considered in my medical decision making (see chart for details).   47 year old female presents for 4-day history of fever, headaches, body aches, cough, congestion and fatigue.  Also reporting mild shortness of breath and chest pain with cough.  Vitals normal and stable.  Patient mildly ill-appearing but nontoxic.  On exam has nasal congestion.  Throat clear.  Chest clear to auscultation.  COVID test obtained by nursing staff.  Patient has had a negative home COVID test.  COVID test is negative here today.  Will obtain chest x-ray to assess for possible pneumonia given persistent fever, cough, chest pressure and shortness of breath.  X-ray performed today shows right upper lobe and right middle lobe pneumonia.  Discussed this with patient.  Treating community-acquired pneumonia and otherwise healthy adult with Augmentin and azithromycin.  Also sent Promethazine DM and encouraged increasing rest and fluids.  Reviewed supportive care.  Reviewed ED precautions.  Reviewed repeat x-ray in 3 to 4 weeks to ensure resolution of pneumonia.  Work note given.  1 acute illness with systemic symptoms.   Final Clinical Impressions(s) / UC Diagnoses   Final diagnoses:  Pneumonia of right upper lobe due to infectious organism  Acute cough  Fever, unspecified     Discharge Instructions      -You have pneumonia.  I sent 2 different antibiotics to the pharmacy to cover most likely bacteria.  Take full course.  You should be breaking the fever and starting to feel better in a couple days with a cough can be persistent for a couple of weeks in some cases. - Increase rest and fluids.  Continue Tylenol and Motrin as needed for fever, body aches and headaches. - If uncontrolled fever, weakness or increased breathing difficulty please go to the ER. - Follow-up with your primary doctor in 3 to 4 weeks to repeat the x-ray make sure the  pneumonia has cleared.     ED Prescriptions     Medication Sig Dispense Auth. Provider   promethazine-dextromethorphan (PROMETHAZINE-DM) 6.25-15 MG/5ML syrup Take 5 mLs by mouth 4 (four) times daily as needed. 118 mL Eusebio Friendly B, PA-C   amoxicillin-clavulanate (AUGMENTIN) 875-125 MG tablet Take 1 tablet by mouth every 12 (twelve) hours for 7 days. 14 tablet Eusebio Friendly B, PA-C   azithromycin (ZITHROMAX) 250 MG tablet Take 1 tablet (250 mg total) by mouth daily. Take first 2 tablets together, then 1 every day until finished. 6 tablet Gareth Morgan      PDMP not reviewed this encounter.   Shirlee Latch, PA-C 10/01/23 [phone]
